# Patient Record
Sex: Male | Born: 1965 | Race: Asian | Hispanic: No | Marital: Married | Smoking: Never smoker
Health system: Southern US, Community
[De-identification: ages and names within clinical notes are randomized; demographics above are authoritative.]

## PROBLEM LIST (undated history)

## (undated) HISTORY — PX: NOSE SURGERY: SHX723

---

## 2003-01-04 ENCOUNTER — Encounter: Admission: RE | Admit: 2003-01-04 | Discharge: 2003-01-04 | Payer: Self-pay | Admitting: Specialist

## 2005-07-18 ENCOUNTER — Ambulatory Visit: Payer: Self-pay | Admitting: Internal Medicine

## 2005-07-22 ENCOUNTER — Ambulatory Visit: Payer: Self-pay | Admitting: Internal Medicine

## 2005-07-28 ENCOUNTER — Ambulatory Visit: Payer: Self-pay | Admitting: Cardiology

## 2005-08-01 ENCOUNTER — Ambulatory Visit: Payer: Self-pay | Admitting: Internal Medicine

## 2007-01-11 ENCOUNTER — Ambulatory Visit: Payer: Self-pay | Admitting: Internal Medicine

## 2007-01-11 DIAGNOSIS — R05 Cough: Secondary | ICD-10-CM

## 2007-01-11 DIAGNOSIS — Z8611 Personal history of tuberculosis: Secondary | ICD-10-CM

## 2007-01-11 LAB — CONVERTED CEMR LAB
ALT: 25 units/L (ref 0–53)
AST: 21 units/L (ref 0–37)
Albumin: 3.9 g/dL (ref 3.5–5.2)
Alkaline Phosphatase: 40 units/L (ref 39–117)
BUN: 9 mg/dL (ref 6–23)
Basophils Absolute: 0 10*3/uL (ref 0.0–0.1)
Basophils Relative: 0.1 % (ref 0.0–1.0)
Bilirubin Urine: NEGATIVE
Bilirubin, Direct: 0.1 mg/dL (ref 0.0–0.3)
CO2: 31 meq/L (ref 19–32)
Calcium: 9.1 mg/dL (ref 8.4–10.5)
Chloride: 104 meq/L (ref 96–112)
Cholesterol: 200 mg/dL (ref 0–200)
Creatinine, Ser: 0.9 mg/dL (ref 0.4–1.5)
Eosinophils Absolute: 0.1 10*3/uL (ref 0.0–0.6)
Eosinophils Relative: 2.1 % (ref 0.0–5.0)
GFR calc Af Amer: 120 mL/min
GFR calc non Af Amer: 99 mL/min
Glucose, Bld: 99 mg/dL (ref 70–99)
HCT: 44.8 % (ref 39.0–52.0)
HDL: 50.9 mg/dL (ref >39.0)
Hemoglobin, Urine: NEGATIVE
Hemoglobin: 15.4 g/dL (ref 13.0–17.0)
Ketones, ur: NEGATIVE mg/dL
LDL Cholesterol: 138 mg/dL — ABNORMAL HIGH (ref 0–99)
Leukocytes, UA: NEGATIVE
Lymphocytes Relative: 36.9 % (ref 12.0–46.0)
MCHC: 34.3 g/dL (ref 30.0–36.0)
MCV: 90.1 fL (ref 78.0–100.0)
Monocytes Absolute: 0.4 10*3/uL (ref 0.2–0.7)
Monocytes Relative: 7.1 % (ref 3.0–11.0)
Neutro Abs: 3 10*3/uL (ref 1.4–7.7)
Neutrophils Relative %: 53.8 % (ref 43.0–77.0)
Nitrite: NEGATIVE
Platelets: 237 10*3/uL (ref 150–400)
Potassium: 3.9 meq/L (ref 3.5–5.1)
RBC: 4.98 M/uL (ref 4.22–5.81)
RDW: 11.9 % (ref 11.5–14.6)
Sodium: 141 meq/L (ref 135–145)
Specific Gravity, Urine: 1.025 (ref 1.000–1.03)
TSH: 0.63 microintl units/mL (ref 0.35–5.50)
Total Bilirubin: 1 mg/dL (ref 0.3–1.2)
Total CHOL/HDL Ratio: 3.9
Total Protein, Urine: NEGATIVE mg/dL
Total Protein: 7.2 g/dL (ref 6.0–8.3)
Triglycerides: 57 mg/dL (ref 0–149)
Urine Glucose: NEGATIVE mg/dL
Urobilinogen, UA: 0.2 (ref 0.0–1.0)
VLDL: 11 mg/dL (ref 0–40)
WBC: 5.5 10*3/uL (ref 4.5–10.5)
pH: 5.5 (ref 5.0–8.0)

## 2007-01-12 ENCOUNTER — Ambulatory Visit: Payer: Self-pay | Admitting: Internal Medicine

## 2007-01-12 DIAGNOSIS — R229 Localized swelling, mass and lump, unspecified: Secondary | ICD-10-CM

## 2007-01-13 ENCOUNTER — Encounter (INDEPENDENT_AMBULATORY_CARE_PROVIDER_SITE_OTHER): Payer: Self-pay | Admitting: *Deleted

## 2007-01-27 ENCOUNTER — Encounter: Payer: Self-pay | Admitting: Internal Medicine

## 2007-09-07 IMAGING — CT CT CHEST W/O CM
1 series · 1 of 2 positions shown · non-contrast
Comparison: Report of chest x-ray from [REDACTED] 07/22/2005 is reviewed.

CLINICAL DATA: Chronic cough, history of TB.  Sinus drainage.  Abnormal chest x-ray.  
 LIMITED CT OF PARANASAL SINUSES:
TECHNIQUE: Limited coronal CT images were obtained through the paranasal sinuses without intravenous contrast.
TECHNIQUE: Multidetector CT imaging of the chest was performed following the standard protocol without IV contrast.  High-resolution imaging was performed.

[Series 1: — · 0.17mm/px · 1 of 2 slices shown]
[im 2/2  lung]
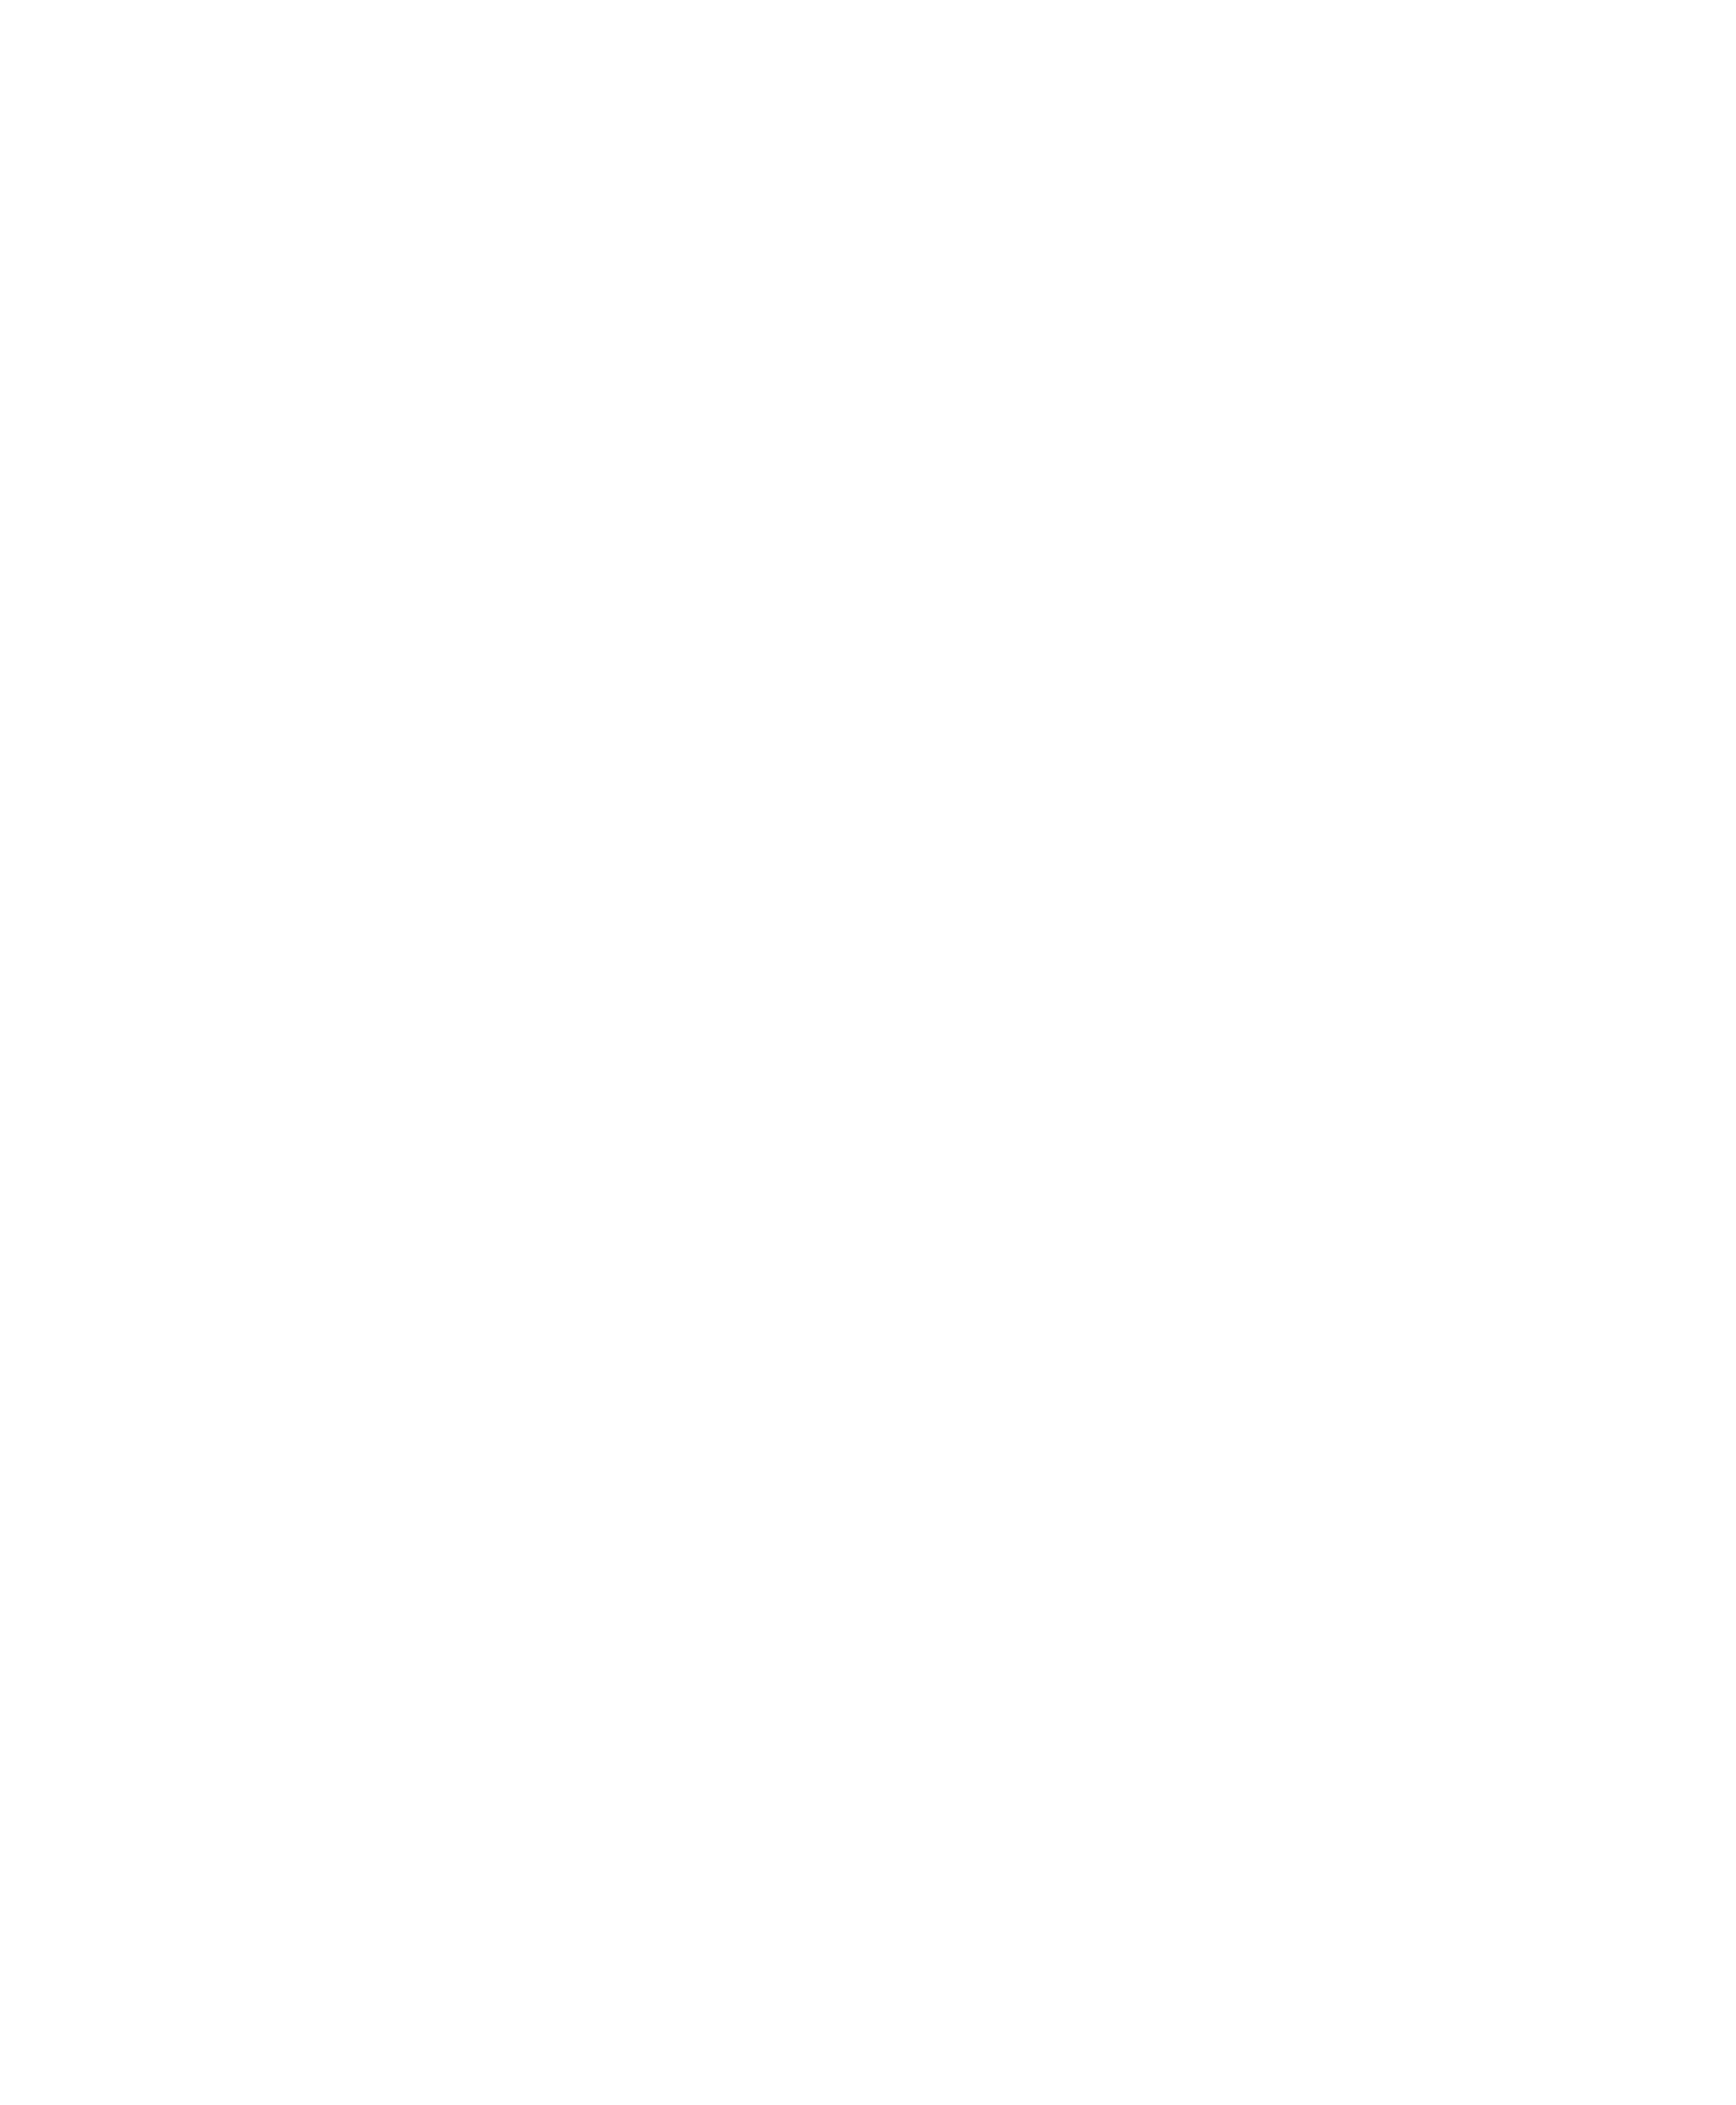

[1 of 2 positions shown; findings below may reference images not displayed]

FINDINGS: Paranasal sinuses are clear.  Nasal septum does not appear deviated.  Visualized intracranial contents are unremarkable.
IMPRESSION: No acute findings.  
 CHEST CT WITHOUT CONTRAST:
FINDINGS: Small mediastinal lymph nodes do not meet CT size criteria for pathologic enlargement.  No hilar or axillary lymphadenopathy.  Heart size normal.  No pericardial fluid.  
 Minimal biapical scarring.  Post-infectious scarring is seen in the apicoposterior segment of the left upper lobe.  No pleural fluid.  The airway is unremarkable.   High-resolution imaging of the chest shows no evidence of interstitial lung disease.   
 Incidental imaging of the upper abdomen shows no acute findings.
IMPRESSION: No acute findings.

## 2008-05-18 ENCOUNTER — Ambulatory Visit: Payer: Self-pay | Admitting: Internal Medicine

## 2008-05-18 LAB — CONVERTED CEMR LAB
ALT: 31 units/L (ref 0–53)
AST: 26 units/L (ref 0–37)
Albumin: 4.1 g/dL (ref 3.5–5.2)
Alkaline Phosphatase: 45 units/L (ref 39–117)
BUN: 15 mg/dL (ref 6–23)
Basophils Absolute: 0 10*3/uL (ref 0.0–0.1)
Basophils Relative: 0 % (ref 0.0–3.0)
Bilirubin, Direct: 0 mg/dL (ref 0.0–0.3)
CO2: 30 meq/L (ref 19–32)
Calcium: 9.3 mg/dL (ref 8.4–10.5)
Chloride: 105 meq/L (ref 96–112)
Cholesterol: 237 mg/dL — ABNORMAL HIGH (ref 0–200)
Creatinine, Ser: 0.9 mg/dL (ref 0.4–1.5)
Direct LDL: 154.1 mg/dL
Eosinophils Absolute: 0.1 10*3/uL (ref 0.0–0.7)
Eosinophils Relative: 1.3 % (ref 0.0–5.0)
GFR calc non Af Amer: 98.27 mL/min (ref >60)
Glucose, Bld: 87 mg/dL (ref 70–99)
HCT: 46.9 % (ref 39.0–52.0)
HDL: 57.9 mg/dL (ref >39.00)
Hemoglobin: 16.3 g/dL (ref 13.0–17.0)
Lymphocytes Relative: 40.3 % (ref 12.0–46.0)
Lymphs Abs: 2.1 10*3/uL (ref 0.7–4.0)
MCHC: 34.7 g/dL (ref 30.0–36.0)
MCV: 90.8 fL (ref 78.0–100.0)
Monocytes Absolute: 0.3 10*3/uL (ref 0.1–1.0)
Monocytes Relative: 6.1 % (ref 3.0–12.0)
Neutro Abs: 2.7 10*3/uL (ref 1.4–7.7)
Neutrophils Relative %: 52.3 % (ref 43.0–77.0)
Platelets: 214 10*3/uL (ref 150.0–400.0)
Potassium: 4 meq/L (ref 3.5–5.1)
RBC: 5.16 M/uL (ref 4.22–5.81)
RDW: 12.3 % (ref 11.5–14.6)
Sodium: 141 meq/L (ref 135–145)
TSH: 0.61 microintl units/mL (ref 0.35–5.50)
Total Bilirubin: 0.9 mg/dL (ref 0.3–1.2)
Total CHOL/HDL Ratio: 4
Total Protein: 7.5 g/dL (ref 6.0–8.3)
Triglycerides: 48 mg/dL (ref 0.0–149.0)
VLDL: 9.6 mg/dL (ref 0.0–40.0)
WBC: 5.2 10*3/uL (ref 4.5–10.5)

## 2008-05-19 ENCOUNTER — Encounter: Payer: Self-pay | Admitting: Internal Medicine

## 2014-09-11 ENCOUNTER — Telehealth: Payer: Self-pay | Admitting: Internal Medicine

## 2014-09-11 NOTE — Telephone Encounter (Signed)
Pt would like to re-est last seen 2010. Can I sch?

## 2014-09-12 NOTE — Telephone Encounter (Signed)
Ok but please make sure he knows that I have limited schedule.

## 2014-09-14 NOTE — Telephone Encounter (Signed)
Pt will think about making and appt and callback  if need

## 2015-11-15 ENCOUNTER — Emergency Department (HOSPITAL_COMMUNITY): Payer: BLUE CROSS/BLUE SHIELD

## 2015-11-15 ENCOUNTER — Encounter (HOSPITAL_COMMUNITY): Payer: Self-pay

## 2015-11-15 ENCOUNTER — Emergency Department (HOSPITAL_COMMUNITY)
Admission: EM | Admit: 2015-11-15 | Discharge: 2015-11-15 | Disposition: A | Discharge status: Home / Self Care | Payer: BLUE CROSS/BLUE SHIELD | Attending: Emergency Medicine | Admitting: Emergency Medicine

## 2015-11-15 DIAGNOSIS — W268XXA Contact with other sharp object(s), not elsewhere classified, initial encounter: Secondary | ICD-10-CM | POA: Diagnosis not present

## 2015-11-15 DIAGNOSIS — Y9289 Other specified places as the place of occurrence of the external cause: Secondary | ICD-10-CM | POA: Insufficient documentation

## 2015-11-15 DIAGNOSIS — S61210A Laceration without foreign body of right index finger without damage to nail, initial encounter: Secondary | ICD-10-CM | POA: Insufficient documentation

## 2015-11-15 DIAGNOSIS — Y999 Unspecified external cause status: Secondary | ICD-10-CM | POA: Diagnosis not present

## 2015-11-15 DIAGNOSIS — S61411A Laceration without foreign body of right hand, initial encounter: Secondary | ICD-10-CM

## 2015-11-15 DIAGNOSIS — S6991XA Unspecified injury of right wrist, hand and finger(s), initial encounter: Secondary | ICD-10-CM | POA: Diagnosis present

## 2015-11-15 DIAGNOSIS — Y939 Activity, unspecified: Secondary | ICD-10-CM | POA: Insufficient documentation

## 2015-11-15 DIAGNOSIS — Z23 Encounter for immunization: Secondary | ICD-10-CM | POA: Insufficient documentation

## 2015-11-15 MED ORDER — TETANUS-DIPHTH-ACELL PERTUSSIS 5-2.5-18.5 LF-MCG/0.5 IM SUSP
0.5000 mL | Freq: Once | INTRAMUSCULAR | Status: AC
  Administered 2015-11-15: 0.5 mL via INTRAMUSCULAR
  Filled 2015-11-15: qty 0.5

## 2015-11-15 MED ORDER — LIDOCAINE HCL (PF) 1 % IJ SOLN
5.0000 mL | Freq: Once | INTRAMUSCULAR | Status: AC
  Administered 2015-11-15: 5 mL
  Filled 2015-11-15: qty 5

## 2015-11-15 MED ORDER — CEPHALEXIN 500 MG PO CAPS: 500.0000 mg | ORAL_CAPSULE | Freq: Four times a day (QID) | ORAL | 0 refills | Status: DC

## 2015-11-15 NOTE — ED Provider Notes (Signed)
[facility]Y DEPT Provider Note   CSN: [id number] Arrival date & time: 11/15/15  1821 By signing my name below, I, Bridgette Habermann, attest that this documentation has been prepared under the direction and in the presence of [facility], [place]. Electronically Signed: Bridgette Habermann, ED Scribe. 11/15/15. 7:12 PM.  History   Chief Complaint Chief Complaint  Patient presents with  . Extremity Laceration   HPI Comments: Jack Hanson is a 50 y.o. male with no pertinent past medical history, who presents to the Emergency Department complaining of a laceration to lower right anterior aspect of forefinger s/p mechanical injury 2 hours ago. Pt states he cut his finger on a ceramic toilet. He rates his current pain an 8/10. Bleeding is controlled with gauze. He denies any additional injuries. He further denies fever, chills, or any other associated symptoms.   The history is provided by the patient. No language interpreter was used.    History reviewed. No pertinent past medical history.  Patient Active Problem List   Diagnosis Date Noted  . LOCALIZED SUPERFICIAL SWELLING MASS OR LUMP 01/12/2007  . COUGH, CHRONIC 01/11/2007  . TUBERCULOSIS, HX OF 01/11/2007    History reviewed. No pertinent surgical history.     Home Medications    Prior to Admission medications   Medication Sig Start Date End Date Taking? Authorizing Provider  cephALEXin (KEFLEX) 500 MG capsule Take 1 capsule (500 mg total) by mouth 4 (four) times daily. 11/15/15   Comfort Iversen Orlene Och, NP    Family History No family history on file.  Social History Social History  Substance Use Topics  . Smoking status: Unknown If Ever Smoked  . Smokeless tobacco: Not on file  . Alcohol use Not on file     Allergies   Review of patient's allergies indicates not on file.   Review of Systems Review of Systems  Constitutional: Negative for fever.  Skin: Positive for wound.  Neurological: Positive for numbness.  All other systems reviewed  and are negative.    Physical Exam Updated Vital Signs BP 128/74 (BP Location: Left Arm)   Pulse 66   Temp 98.1 F (36.7 C) (Oral)   Resp 18   SpO2 98%   Physical Exam  Constitutional: He appears well-developed and well-nourished.  HENT:  Head: Normocephalic.  Eyes: Conjunctivae are normal.  Cardiovascular: Normal rate.   Pulmonary/Chest: Effort normal. No respiratory distress.  Abdominal: He exhibits no distension.  Musculoskeletal: Normal range of motion.  Good strength. Decreased touch sensation to distal aspect of finger. Cap refill < 2 seconds. Full flexion and extension of the finger. Did not visualize any tendons.  Neurological: He is alert.  Skin: Skin is warm and dry. Laceration noted.  4 cm laceration to the base of the right index finger, palmar aspect, that extend to the whip space.  Psychiatric: He has a normal mood and affect. His behavior is normal.  Nursing note and vitals reviewed.    ED Treatments / Results  DIAGNOSTIC STUDIES: Oxygen Saturation is 99% on RA, normal by my interpretation.    COORDINATION OF CARE: 7:10 PM Discussed treatment plan with pt at bedside which includes x-ray and laceration repair and pt agreed to plan.   Radiology Dg Hand Complete Right  Result Date: 11/15/2015 CLINICAL DATA:  Laceration of the right forefinger. EXAM: RIGHT HAND - COMPLETE 3+ VIEW COMPARISON:  None. FINDINGS: There is no evidence of fracture or dislocation. There is no evidence of arthropathy or other focal bone abnormality. There is  a laceration of the right second digit with soft tissue debris measuring up to 2 mm seen at the level of the second proximal phalanx along it's radial aspect. No bony involvement is apparent. Carpal bones appear intact. IMPRESSION: Soft tissue laceration with minimal soft tissue debris at the level of the second mid proximal phalangeal shaft. No acute osseous abnormality nor bony involvement from the laceration. Electronically Signed    By: Tollie Ethavid  Kwon M.D.   On: 11/15/2015 19:41    Procedures .Marland Kitchen.Laceration Repair Date/Time: 11/15/2015 8:04 PM Performed by: Janne NapoleonNEESE, Penny Arrambide M Authorized by: Janne NapoleonNEESE, Callin Ashe M   Consent:    Consent obtained:  Verbal   Consent given by:  Patient Anesthesia (see MAR for exact dosages):    Anesthesia method:  Local infiltration   Local anesthetic:  Lidocaine 1% w/o epi Laceration details:    Location:  Finger   Finger location:  R index finger Repair type:    Repair type:  Simple Pre-procedure details:    Preparation:  Patient was prepped and draped in usual sterile fashion and imaging obtained to evaluate for foreign bodies Exploration:    Wound exploration: wound explored through full range of motion and entire depth of wound probed and visualized     Wound extent: no tendon damage noted   Treatment:    Area cleansed with:  Saline   Amount of cleaning:  Standard   Irrigation solution:  Sterile saline   Irrigation method:  Pressure wash   Visualized foreign bodies/material removed: no   Skin repair:    Repair method:  Sutures   Suture size:  4-0   Suture material:  Prolene   Suture technique:  Simple interrupted   Number of sutures:  5 Approximation:    Approximation:  Loose   Vermilion border: well-aligned   Post-procedure details:    Dressing:  Non-adherent dressing   Patient tolerance of procedure:  Tolerated well, no immediate complications     Medications Ordered in ED Medications  lidocaine (PF) (XYLOCAINE) 1 % injection 5 mL (5 mLs Infiltration Given 11/15/15 1932)  Tdap (BOOSTRIX) injection 0.5 mL (0.5 mLs Intramuscular Given 11/15/15 1950)     Initial Impression / Assessment and Plan / ED Course  I have reviewed the triage vital signs and the nursing notes.  Pertinent imaging results that were available during my care of the patient were reviewed by me and considered in my medical decision making (see chart for details).  Clinical Course  I discussed this case  with Dr. Merlyn LotKuzma. I Will irrigate the wound, close, start antibiotics and f/u in the office. *  Final Clinical Impressions(s) / ED Diagnoses   Final diagnoses:  Laceration of right hand without foreign body, initial encounter   Tetanus updated in ED. Laceration occurred < 12 hours prior to repair. Discussed laceration care with pt and answered questions. Pt to f-u with Dr. Merlyn LotKuzma in one day and wound check sooner should there be signs of dehiscence or infection. Pt is without focal neuro deficits and stable with no complaints prior to dc.    New Prescriptions Discharge Medication List as of 11/15/2015  8:27 PM    START taking these medications   Details  cephALEXin (KEFLEX) 500 MG capsule Take 1 capsule (500 mg total) by mouth 4 (four) times daily., Starting Thu 11/15/2015, Print       I personally performed the services described in this documentation, which was scribed in my presence. The recorded information has been reviewed and  is accurate.     [address] [place], NP 11/17/15 1631    Glynn Octave, MD 11/19/15 [phone]

## 2015-11-15 NOTE — ED Notes (Signed)
EDP at bedside  

## 2015-11-15 NOTE — Discharge Instructions (Signed)
Call Dr. Merrilee SeashoreKuzma's office in the morning to schedule follow up. Return here as needed.  Take tylenol and ibuprofen as needed for pain.

## 2015-11-15 NOTE — ED Notes (Signed)
Pt to xray

## 2015-11-15 NOTE — ED Triage Notes (Signed)
Pt has deep un-approximated, jagged laceration to lower right anterior aspect of forefinger. States he cut it on ceramic toilet. Bleeding controlled with gauze. He is able to move digit and positive sensation.

## 2016-03-19 DIAGNOSIS — Z13220 Encounter for screening for lipoid disorders: Secondary | ICD-10-CM | POA: Diagnosis not present

## 2016-03-19 DIAGNOSIS — Z0000 Encounter for general adult medical examination without abnormal findings: Secondary | ICD-10-CM | POA: Diagnosis not present

## 2016-03-19 DIAGNOSIS — Z125 Encounter for screening for malignant neoplasm of prostate: Secondary | ICD-10-CM | POA: Diagnosis not present

## 2016-04-16 DIAGNOSIS — Z1211 Encounter for screening for malignant neoplasm of colon: Secondary | ICD-10-CM | POA: Diagnosis not present

## 2017-03-31 DIAGNOSIS — E7800 Pure hypercholesterolemia, unspecified: Secondary | ICD-10-CM | POA: Diagnosis not present

## 2017-03-31 DIAGNOSIS — Z0000 Encounter for general adult medical examination without abnormal findings: Secondary | ICD-10-CM | POA: Diagnosis not present

## 2017-10-01 DIAGNOSIS — J3489 Other specified disorders of nose and nasal sinuses: Secondary | ICD-10-CM | POA: Diagnosis not present

## 2017-10-01 DIAGNOSIS — J342 Deviated nasal septum: Secondary | ICD-10-CM | POA: Diagnosis not present

## 2017-11-02 DIAGNOSIS — J342 Deviated nasal septum: Secondary | ICD-10-CM | POA: Diagnosis not present

## 2017-11-02 DIAGNOSIS — J343 Hypertrophy of nasal turbinates: Secondary | ICD-10-CM | POA: Diagnosis not present

## 2017-11-05 DIAGNOSIS — J343 Hypertrophy of nasal turbinates: Secondary | ICD-10-CM | POA: Diagnosis not present

## 2017-11-05 DIAGNOSIS — J342 Deviated nasal septum: Secondary | ICD-10-CM | POA: Diagnosis not present

## 2017-12-25 IMAGING — CR DG HAND COMPLETE 3+V*R*
3 series · 3 of 3 positions shown · non-contrast
Comparison: None.

CLINICAL DATA: Laceration of the right forefinger.

EXAM:
RIGHT HAND - COMPLETE 3+ VIEW

[hand pa]
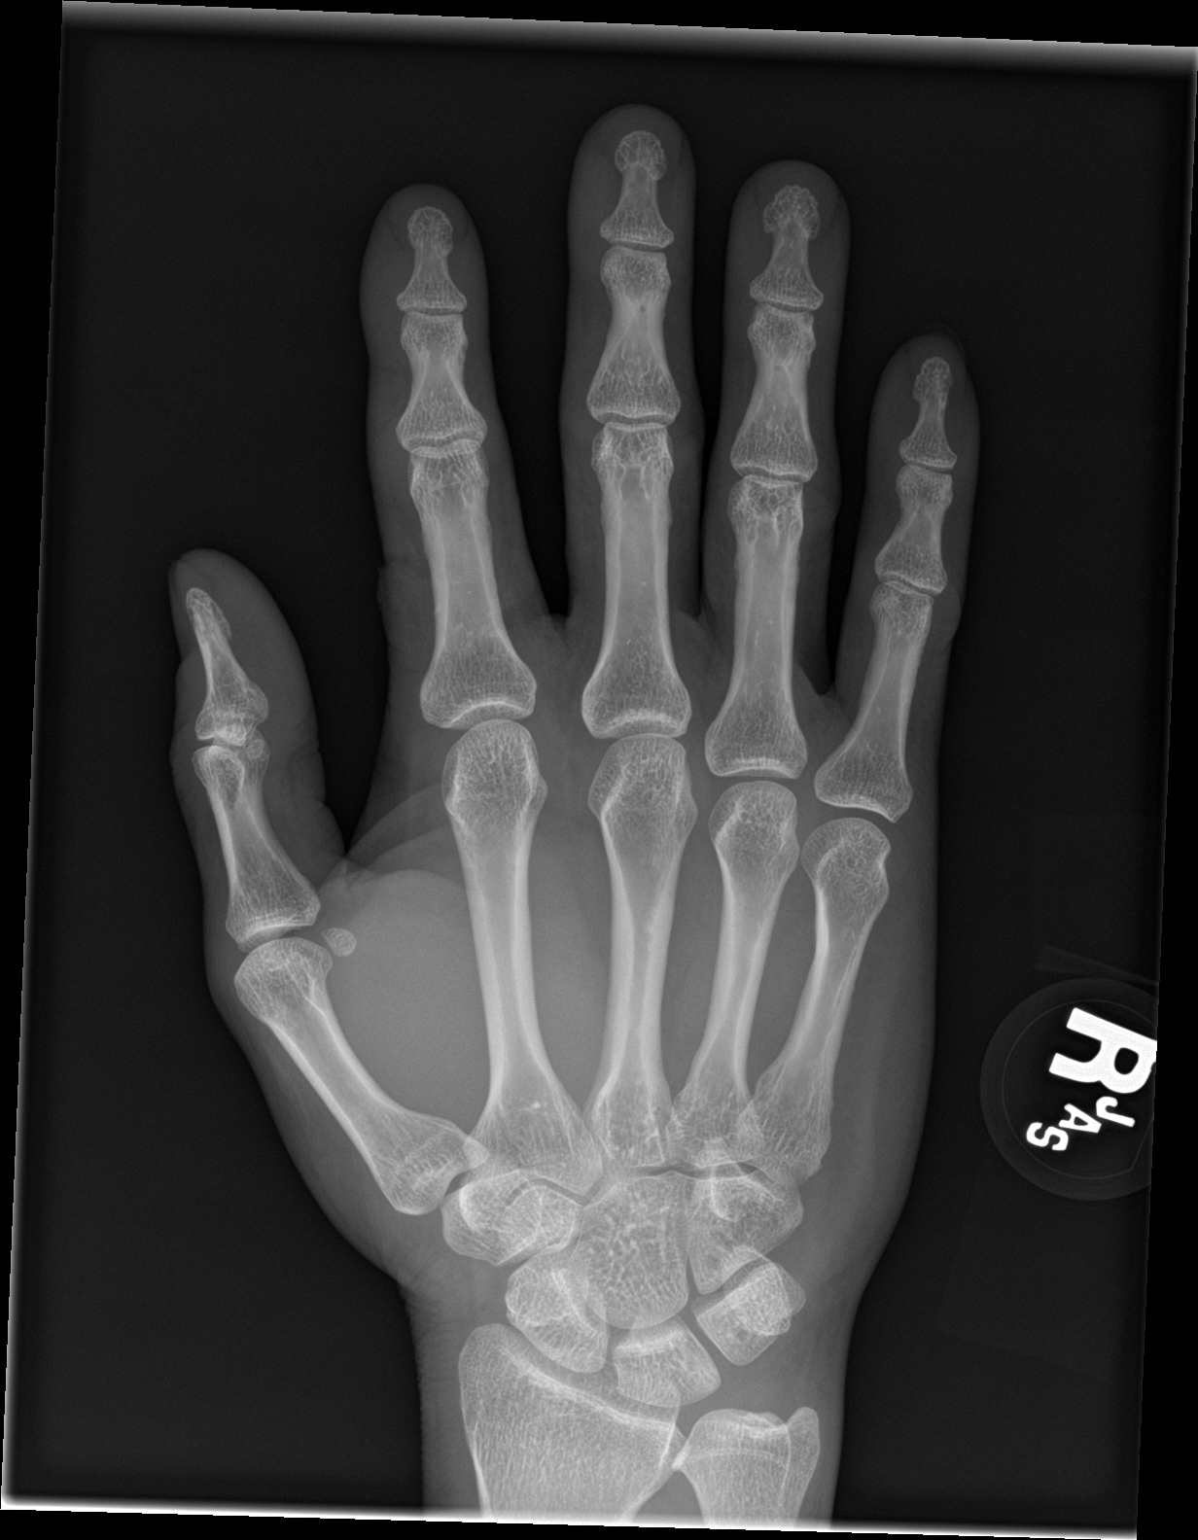

[hand obl]
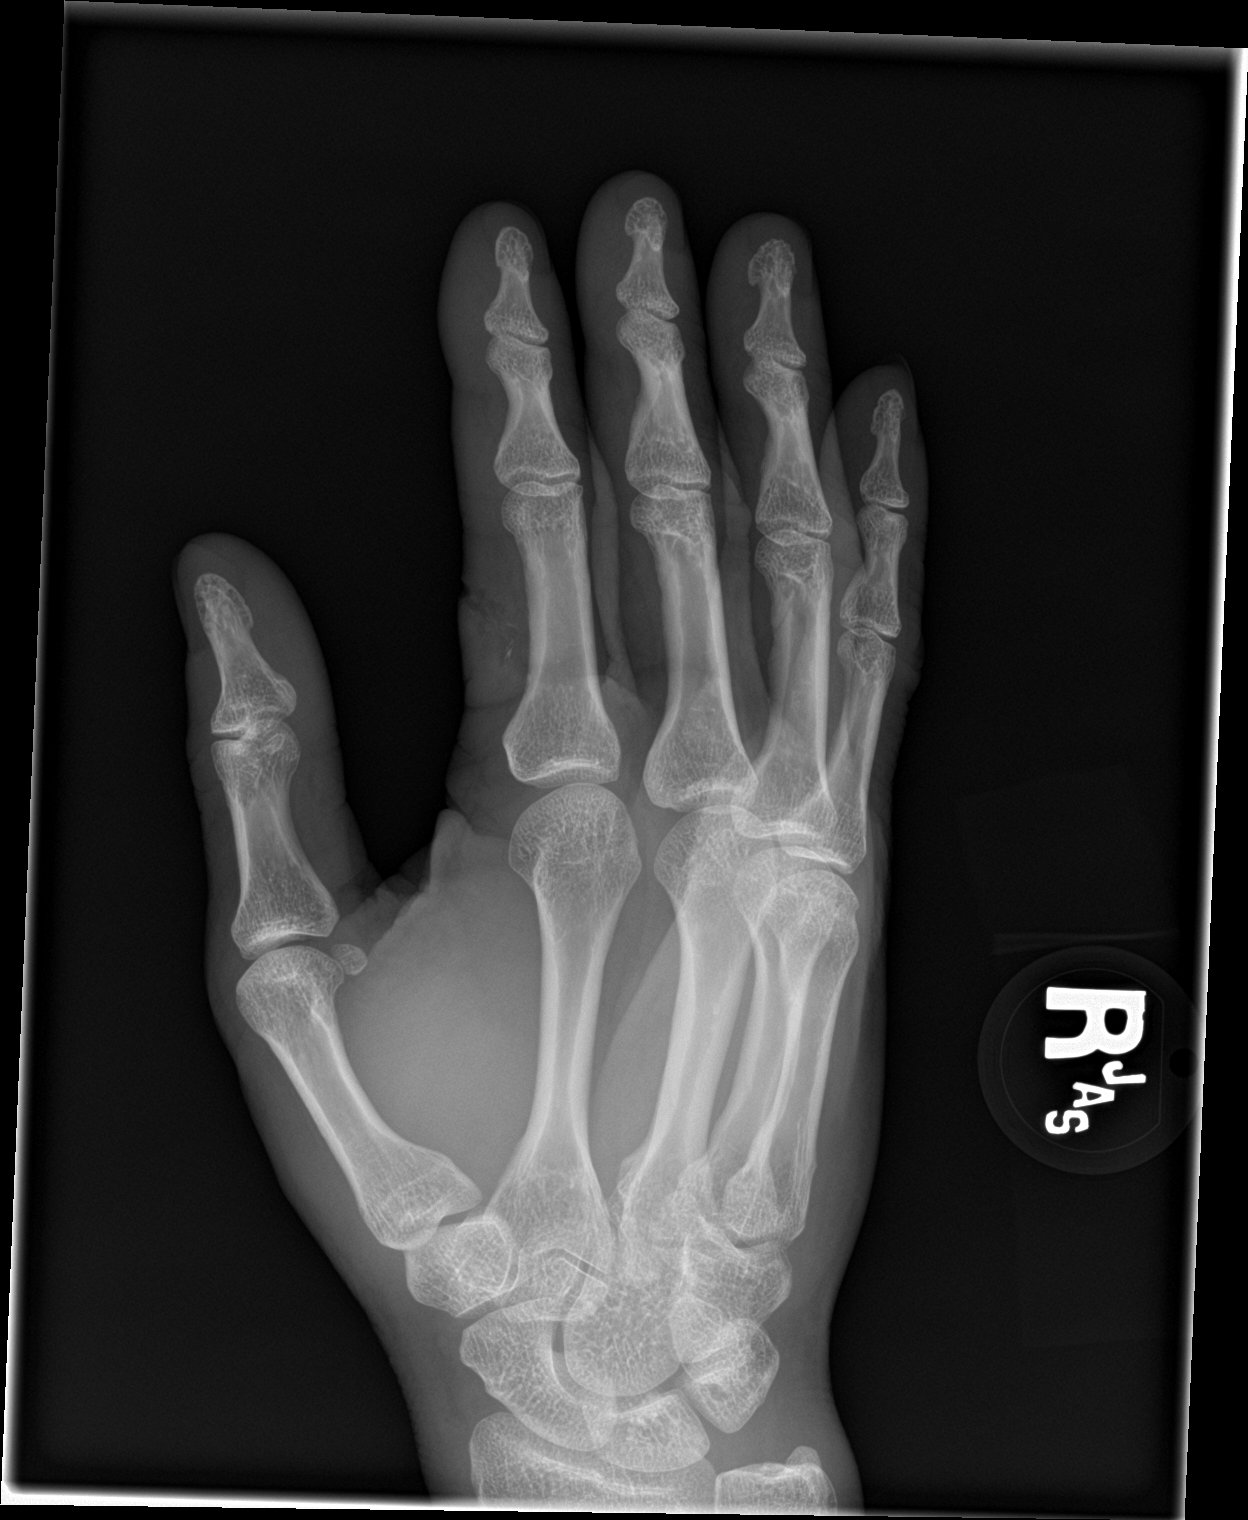

[hand lat]
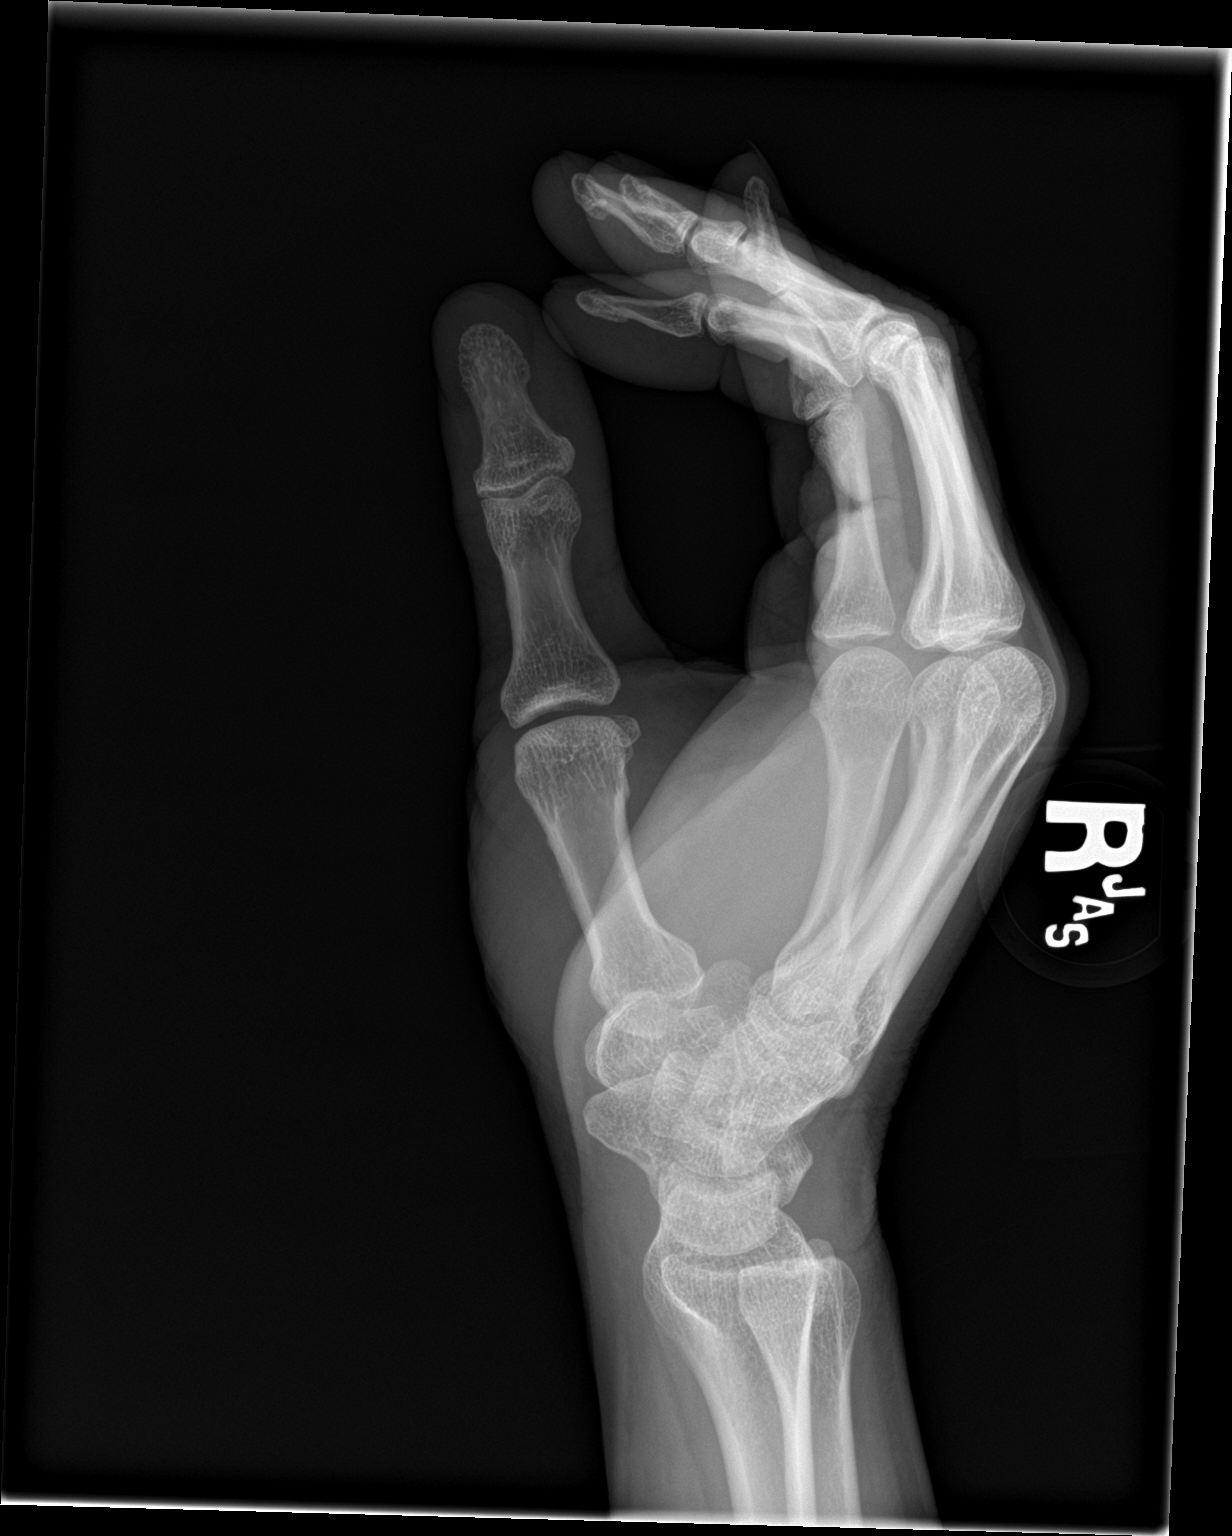

[3 of 3 positions shown; findings below may reference images not displayed]

FINDINGS: There is no evidence of fracture or dislocation. There is no
evidence of arthropathy or other focal bone abnormality. There is a
laceration of the right second digit with soft tissue debris
measuring up to 2 mm seen at the level of the second proximal
phalanx along it's radial aspect. No bony involvement is apparent.
Carpal bones appear intact.
IMPRESSION: Soft tissue laceration with minimal soft tissue debris at the level
of the second mid proximal phalangeal shaft. No acute osseous
abnormality nor bony involvement from the laceration.

## 2018-02-22 ENCOUNTER — Ambulatory Visit (HOSPITAL_COMMUNITY)
Admission: EM | Admit: 2018-02-22 | Discharge: 2018-02-22 | Disposition: A | Discharge status: Home / Self Care | Payer: BLUE CROSS/BLUE SHIELD | Attending: Urgent Care | Admitting: Urgent Care

## 2018-02-22 ENCOUNTER — Encounter (HOSPITAL_COMMUNITY): Payer: Self-pay | Admitting: Emergency Medicine

## 2018-02-22 ENCOUNTER — Other Ambulatory Visit: Payer: Self-pay

## 2018-02-22 DIAGNOSIS — R0982 Postnasal drip: Secondary | ICD-10-CM | POA: Diagnosis not present

## 2018-02-22 DIAGNOSIS — Z9889 Other specified postprocedural states: Secondary | ICD-10-CM | POA: Insufficient documentation

## 2018-02-22 MED ORDER — CETIRIZINE HCL 10 MG PO TABS: 10.0000 mg | ORAL_TABLET | Freq: Every day | ORAL | 1 refills | Status: AC

## 2018-02-22 NOTE — ED Provider Notes (Signed)
MRN: [id number] DOB: 1965-09-13  Subjective:   Jack Hanson is a 53 y.o. male presenting for 7-month history of persistent drainage of his sinuses.  Patient had surgery for nasal septal deviation with Dr. Lazarus Salines in October.  Reports that he can breathe through his nose fine now but has had persistent drainage.  He has not taken any medications and has not followed up with a surgeon in the past 2 to 3 months.  Denies smoking cigarettes.  Denies taking any allergy medications.   No Known Allergies  History reviewed. No pertinent past medical history.   Past Surgical History:  Procedure Laterality Date  . NOSE SURGERY      ROS Denies fever, sinus pain, ear pain, throat pain, chest pain, shortness of breath, wheezing, nausea, vomiting, belly pain.  Objective:   Vitals: BP 92/62 (BP Location: Left Arm)   Pulse 71   Temp 98 F (36.7 C) (Oral)   Resp 16   SpO2 99%   Physical Exam Constitutional:      General: He is not in acute distress.    Appearance: Normal appearance. He is well-developed and normal weight. He is not ill-appearing, toxic-appearing or diaphoretic.  HENT:     Head: Normocephalic and atraumatic.     Right Ear: Tympanic membrane, ear canal and external ear normal. There is no impacted cerumen.     Left Ear: Tympanic membrane, ear canal and external ear normal. There is no impacted cerumen.     Nose: Nose normal. No septal deviation, nasal tenderness, mucosal edema, congestion or rhinorrhea.     Right Turbinates: Not enlarged, swollen or pale.     Left Turbinates: Not enlarged, swollen or pale.     Mouth/Throat:     Mouth: Mucous membranes are moist.     Pharynx: Oropharynx is clear. No oropharyngeal exudate or posterior oropharyngeal erythema.     Comments: There is slight postnasal drainage in the posterior oropharynx. Eyes:     General: No scleral icterus.       Right eye: No discharge.        Left eye: No discharge.     Extraocular Movements: Extraocular  movements intact.     Conjunctiva/sclera: Conjunctivae normal.     Pupils: Pupils are equal, round, and reactive to light.  Neck:     Musculoskeletal: Normal range of motion and neck supple. No neck rigidity or muscular tenderness.  Cardiovascular:     Rate and Rhythm: Normal rate and regular rhythm.     Heart sounds: Normal heart sounds. No murmur. No friction rub. No gallop.   Pulmonary:     Effort: Pulmonary effort is normal. No respiratory distress.     Breath sounds: Normal breath sounds. No stridor. No wheezing, rhonchi or rales.  Neurological:     General: No focal deficit present.     Mental Status: He is alert and oriented to person, place, and time.  Psychiatric:        Mood and Affect: Mood normal.        Behavior: Behavior normal.        Thought Content: Thought content normal.     Assessment and Plan :   Post-nasal drainage  History of nasal surgery  Do not suspect an infectious etiology for patient's symptoms.  Recommended that he use Zyrtec and pseudoephedrine for his postnasal drainage.  He is agreeable to contact his ENT doctor as soon as possible. Counseled patient on potential for adverse effects with medications prescribed today,  patient verbalized understanding. ER and return-to-clinic precautions discussed, patient verbalized understanding.    Wallis Bamberg, PA-C 02/22/18 1540

## 2018-02-22 NOTE — ED Triage Notes (Signed)
Cough and chest congestion that started 2-3 months ago.  Now symptoms have worsened.  Upper back pain/soreness.

## 2018-02-22 NOTE — Discharge Instructions (Signed)
You may pick up over-the-counter pseudoephedrine (Sudafed) and use this for post-nasal drainage, sinus congestion at a dose of 60mg  every 8 hours or 120mg  every 12 hours as needed. Make sure you ask the pharmacist for this as you have to show your license to get it. Call your surgeon and ask for an office visit to do a recheck on your nasal surgery.
